# Patient Record
Sex: Female | Born: 1960 | Race: Black or African American | Hispanic: No | Marital: Single | Smoking: Never smoker
Health system: Southern US, Community
[De-identification: ages and names within clinical notes are randomized; demographics above are authoritative.]

## PROBLEM LIST (undated history)

## (undated) DIAGNOSIS — D571 Sickle-cell disease without crisis: Secondary | ICD-10-CM

## (undated) DIAGNOSIS — M8700 Idiopathic aseptic necrosis of unspecified bone: Secondary | ICD-10-CM

---

## 2016-09-27 ENCOUNTER — Emergency Department (HOSPITAL_COMMUNITY)
Admission: EM | Admit: 2016-09-27 | Discharge: 2016-09-27 | Disposition: A | Discharge status: Home / Self Care | Payer: PRIVATE HEALTH INSURANCE | Attending: Emergency Medicine | Admitting: Emergency Medicine

## 2016-09-27 ENCOUNTER — Encounter (HOSPITAL_COMMUNITY): Payer: Self-pay | Admitting: Emergency Medicine

## 2016-09-27 ENCOUNTER — Emergency Department (HOSPITAL_COMMUNITY): Payer: PRIVATE HEALTH INSURANCE

## 2016-09-27 DIAGNOSIS — Z79899 Other long term (current) drug therapy: Secondary | ICD-10-CM | POA: Diagnosis not present

## 2016-09-27 DIAGNOSIS — D5700 Hb-SS disease with crisis, unspecified: Secondary | ICD-10-CM | POA: Diagnosis not present

## 2016-09-27 DIAGNOSIS — Z7982 Long term (current) use of aspirin: Secondary | ICD-10-CM | POA: Insufficient documentation

## 2016-09-27 HISTORY — DX: Idiopathic aseptic necrosis of unspecified bone: M87.00

## 2016-09-27 HISTORY — DX: Sickle-cell disease without crisis: D57.1

## 2016-09-27 LAB — CBC WITH DIFFERENTIAL/PLATELET
BASOS PCT: 0 %
Basophils Absolute: 0 10*3/uL (ref 0.0–0.1)
Eosinophils Absolute: 0 10*3/uL (ref 0.0–0.7)
Eosinophils Relative: 0 %
HEMATOCRIT: 27.6 % — AB (ref 36.0–46.0)
Hemoglobin: 9.7 g/dL — ABNORMAL LOW (ref 12.0–15.0)
LYMPHS PCT: 13 %
Lymphs Abs: 1.6 10*3/uL (ref 0.7–4.0)
MCH: 27 pg (ref 26.0–34.0)
MCHC: 35.1 g/dL (ref 30.0–36.0)
MCV: 76.9 fL — AB (ref 78.0–100.0)
MONOS PCT: 4 %
Monocytes Absolute: 0.5 10*3/uL (ref 0.1–1.0)
NEUTROS ABS: 10.5 10*3/uL — AB (ref 1.7–7.7)
Neutrophils Relative %: 83 %
Platelets: 110 10*3/uL — ABNORMAL LOW (ref 150–400)
RBC: 3.59 MIL/uL — ABNORMAL LOW (ref 3.87–5.11)
RDW: 18.3 % — AB (ref 11.5–15.5)
WBC: 12.6 10*3/uL — ABNORMAL HIGH (ref 4.0–10.5)

## 2016-09-27 LAB — COMPREHENSIVE METABOLIC PANEL
ALBUMIN: 4.4 g/dL (ref 3.5–5.0)
ALK PHOS: 79 U/L (ref 38–126)
ALT: 30 U/L (ref 14–54)
ANION GAP: 10 (ref 5–15)
AST: 45 U/L — AB (ref 15–41)
BUN: 10 mg/dL (ref 6–20)
CALCIUM: 9.5 mg/dL (ref 8.9–10.3)
CO2: 24 mmol/L (ref 22–32)
Chloride: 106 mmol/L (ref 101–111)
Creatinine, Ser: 0.76 mg/dL (ref 0.44–1.00)
GFR calc Af Amer: >60 mL/min (ref >60)
GFR calc non Af Amer: >60 mL/min (ref >60)
GLUCOSE: 160 mg/dL — AB (ref 65–99)
POTASSIUM: 3.6 mmol/L (ref 3.5–5.1)
Sodium: 140 mmol/L (ref 135–145)
Total Bilirubin: 1.5 mg/dL — ABNORMAL HIGH (ref 0.3–1.2)
Total Protein: 7.4 g/dL (ref 6.5–8.1)

## 2016-09-27 LAB — I-STAT TROPONIN, ED
TROPONIN I, POC: 0 ng/mL (ref 0.00–0.08)
Troponin i, poc: 0 ng/mL (ref 0.00–0.08)

## 2016-09-27 LAB — RETICULOCYTES
RBC.: 3.59 MIL/uL — ABNORMAL LOW (ref 3.87–5.11)
Retic Count, Absolute: 172.3 10*3/uL (ref 19.0–186.0)
Retic Ct Pct: 4.8 % — ABNORMAL HIGH (ref 0.4–3.1)

## 2016-09-27 MED ORDER — HYDROMORPHONE HCL 4 MG/ML IJ SOLN: 0.5000 mg | INTRAMUSCULAR | Status: AC

## 2016-09-27 MED ORDER — SODIUM CHLORIDE 0.45 % IV SOLN
INTRAVENOUS | Status: DC
  Administered 2016-09-27: 12:00:00 via INTRAVENOUS

## 2016-09-27 MED ORDER — HYDROMORPHONE HCL 4 MG/ML IJ SOLN: 0.5000 mg | INTRAMUSCULAR | Status: DC

## 2016-09-27 MED ORDER — HYDROMORPHONE HCL 4 MG/ML IJ SOLN
0.5000 mg | INTRAMUSCULAR | Status: DC
  Administered 2016-09-27: 1 mg via INTRAVENOUS

## 2016-09-27 MED ORDER — HYDROMORPHONE HCL 4 MG/ML IJ SOLN
0.5000 mg | INTRAMUSCULAR | Status: AC
  Administered 2016-09-27: 1 mg via INTRAVENOUS
  Filled 2016-09-27: qty 1

## 2016-09-27 MED ORDER — HYDROMORPHONE HCL 4 MG/ML IJ SOLN
0.5000 mg | INTRAMUSCULAR | Status: AC
  Filled 2016-09-27: qty 1

## 2016-09-27 NOTE — ED Provider Notes (Signed)
[facility]-EMERGENCY DEPT Provider Note   CSN: [id number] Arrival date & time: 09/27/16  1057     History   Chief Complaint Chief Complaint  Patient presents with  . Sickle Cell Pain Crisis    HPI Julia Freeman is a 56 y.o. female.  The history is provided by the patient.  Sickle Cell Pain Crisis  Location:  Chest, hip and upper extremity Severity:  Severe Onset quality:  Gradual Duration:  1 day Similar to previous crisis episodes: no   Timing:  Constant Progression:  Worsening Chronicity:  New Usual hemoglobin level:  11 Date of last transfusion:  A few months ago Frequency of attacks:  Infrequent History of pulmonary emboli: no   Context comment:  Traveling Relieved by:  Nothing Worsened by:  Activity and movement Ineffective treatments:  Prescription drugs Associated symptoms: chest pain, nausea and shortness of breath   Associated symptoms: no congestion, no cough, no fatigue, no fever, no headaches, no sore throat, no swelling of legs, no vomiting and no wheezing    56 year old female with 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 She is traveling from out of town on her way home to [place] where she receives her typical sickle cell care. Staying with her niece in town. States that yesterday evening began to have gradually worsening onset of pain in the left hip radiating down to the left leg, right shoulder radiating across the chest, and chest wall pain. Took 2 doses of her home Percocet without significant relief. No cough, fevers or chills, headaches, abdominal pain, nausea or vomiting, diarrhea or urinary symptoms. No lower extremity edema. Pain is worse with any movement or ambulation. Sickle cell crises in the past does present with joint pains, but typically not having chest discomfort with sickle cell crises. Past Medical History:  Diagnosis Date  . Avascular necrosis (HCC)   . Sickle  cell anemia (HCC)     There are no active problems to display for this patient.   History reviewed. No pertinent surgical history.  OB History    No data available       Home Medications    Prior to Admission medications   Medication Sig Start Date End Date Taking? Authorizing Provider  aspirin EC 81 MG tablet Take 81 mg by mouth daily.   Yes [provider]  folic acid (FOLVITE) 1 MG tablet Take 1 mg by mouth daily.   Yes [provider]  Multiple Vitamin (MULTIVITAMIN WITH MINERALS) TABS tablet Take 1 tablet by mouth daily.   Yes [provider]  oxyCODONE-acetaminophen (PERCOCET/ROXICET) 5-325 MG tablet Take 1 tablet by mouth every 4 (four) hours as needed for severe pain.   Yes [provider]  ranitidine (ZANTAC) 75 MG tablet Take 75 mg by mouth daily.   Yes [provider]  VITAMIN A PO Take 1 tablet by mouth daily.   Yes [provider]    Family History History reviewed. No pertinent family history.  Social History Social History  Substance Use Topics  . Smoking status: Never Smoker  . Smokeless tobacco: Never Used  . Alcohol use No     Allergies   Patient has no known allergies.   Review of Systems Review of Systems  Constitutional: Negative for fatigue and fever.  HENT: Negative for congestion and sore throat.   Respiratory: Positive for shortness of breath. Negative for cough and wheezing.   Cardiovascular:  Positive for chest pain.  Gastrointestinal: Positive for nausea. Negative for vomiting.  Neurological: Negative for headaches.  All other systems reviewed and are negative.    Physical Exam Updated Vital Signs BP 128/86   Pulse (!) 102   Temp 97.7 F (36.5 C) (Oral)   Resp 10   Wt 68.9 kg (152 lb)   SpO2 99%   Physical Exam Physical Exam  Nursing note and vitals reviewed. Constitutional: non-toxic, and in no acute distress Head: Normocephalic and atraumatic.  Mouth/Throat:  Oropharynx is clear and moist.  Neck: Normal range of motion. Neck supple.  Cardiovascular: Normal rate and regular rhythm.   Pulmonary/Chest: Effort normal and breath sounds normal.  Abdominal: Soft. There is no tenderness. There is no rebound and no guarding.  Musculoskeletal: Normal range of motion. no edema Neurological: Alert, no facial droop, fluent speech, moves all extremities symmetrically Skin: Skin is warm and dry.  Psychiatric: Cooperative   ED Treatments / Results  Labs (all labs ordered are listed, but only abnormal results are displayed) Labs Reviewed  COMPREHENSIVE METABOLIC PANEL - Abnormal; Notable for the following:       Result Value   Glucose, Bld 160 (*)    AST 45 (*)    Total Bilirubin 1.5 (*)    All other components within normal limits  CBC WITH DIFFERENTIAL/PLATELET - Abnormal; Notable for the following:    WBC 12.6 (*)    RBC 3.59 (*)    Hemoglobin 9.7 (*)    HCT 27.6 (*)    MCV 76.9 (*)    RDW 18.3 (*)    Platelets 110 (*)    Neutro Abs 10.5 (*)    All other components within normal limits  RETICULOCYTES - Abnormal; Notable for the following:    Retic Ct Pct 4.8 (*)    RBC. 3.59 (*)    All other components within normal limits  I-STAT TROPOININ, ED  I-STAT TROPOININ, ED    EKG  EKG Interpretation  Date/Time:  Tuesday Sep 27 2016 15:59:44 EDT Ventricular Rate:  103 PR Interval:    QRS Duration: 83 QT Interval:  342 QTC Calculation: 448 R Axis:   31 Text Interpretation:  Sinus tachycardia Abnormal R-wave progression, early transition no acute changes  Confirmed by Nakai Pollio MD, Jordanny Waddington [phone]([id number]) on 09/27/2016 4:02:13 PM       Radiology Dg Chest 2 View  Result Date: 09/27/2016 CLINICAL DATA:  Sickle cell anemia.  Pain. EXAM: CHEST  2 VIEW COMPARISON:  No recent prior . FINDINGS: Mediastinum and hilar structures normal. Lungs are clear. Heart size normal. No pleural effusion or pneumothorax. Sclerotic changes are noted about both humeral heads most  likely secondary to avascular necrosis related to patient's sickle cell anemia. IMPRESSION: 1. No acute cardiopulmonary disease. 2. Sclerotic changes both humeral heads, most likely secondary to avascular necrosis related to the patient's sickle cell anemia. Electronically Signed   By: Maisie Fushomas  Register   On: 09/27/2016 13:07    Procedures Procedures (including critical care time)  Medications Ordered in ED Medications  0.45 % sodium chloride infusion ( Intravenous New Bag/Given 09/27/16 1208)  HYDROmorphone (DILAUDID) injection 1 mg (0 mg Intravenous Hold 09/27/16 1344)    Or  HYDROmorphone (DILAUDID) injection 1 mg ( Subcutaneous See Alternative 09/27/16 1344)  HYDROmorphone (DILAUDID) injection 1 mg (not administered)    Or  HYDROmorphone (DILAUDID) injection 1 mg (not administered)  HYDROmorphone (DILAUDID) injection 1 mg (1 mg Intravenous Given 09/27/16 1208)    Or  HYDROmorphone (DILAUDID) injection 1 mg ( Subcutaneous See Alternative 09/27/16 1208)  HYDROmorphone (DILAUDID) injection 1 mg (1 mg Intravenous Given 09/27/16 1304)    Or  HYDROmorphone (DILAUDID) injection 1 mg ( Subcutaneous See Alternative 09/27/16 1304)     Initial Impression / Assessment and Plan / ED Course  I have reviewed the triage vital signs and the nursing notes.  Pertinent labs & imaging results that were available during my care of the patient were reviewed by me and considered in my medical decision making (see chart for details).     Presenting with left leg pain, right shoulder pain and chest pain over past day. Traveling from [place], where her primary sickle cell care has been. She is afebrile, no tachypnea or hypoxia. Hemodynamically stable. Pain seem consistent with sickle cell pain crisis. CXR visualized and without infiltate, edema or other acute processes. She has anemia 9.7, baseline 10-11 per patient. She has appropriate reticulocytosis. EKG and serial troponins normal. Pain atypical for ACS. Given two  doses of dilaudid with near resolution of pain, but does become sedated and mildly hypoxic due to sedation requiring supplemental oxygen. She is observed in ED until more awake, and is weaned off of O2.   Does have mild return of pain in left leg when more awake, and given additional dose of dilaudid. She feels that pain is controlled that she can manage symptoms with oral medications at home. Strict return and follow-up instructions reviewed. She expressed understanding of all discharge instructions and felt comfortable with the plan of care.   Final Clinical Impressions(s) / ED Diagnoses   Final diagnoses:  Sickle cell pain crisis [facility])    New Prescriptions New Prescriptions   No medications on file     Lavera Guise, MD 09/27/16 1726

## 2016-09-27 NOTE — ED Notes (Signed)
Patient transported to X-ray 

## 2016-09-27 NOTE — ED Triage Notes (Signed)
Pt reports sickle cell pain since midnight. Pain in chest, shoulders and L leg. Location of pain unusual for crisis pain.

## 2016-09-27 NOTE — ED Notes (Signed)
Pt went as low as 92 during walk down the hall.  Currently 97% at rest on no O2

## 2016-09-27 NOTE — Discharge Instructions (Signed)
Please continue to take your home percocet for any ongoing sickle cell pain. You can take 1-2 tablets of your percocet every 4-6 hours.  Please return without fail for worsening symptoms, including fever, difficulty breathing, worsening pain, or any other symptoms concerning to you.

## 2018-10-12 IMAGING — CR DG CHEST 2V
2 series · 2 of 2 positions shown · non-contrast
Comparison: No recent prior .

CLINICAL DATA: Sickle cell anemia.  Pain.

EXAM:
CHEST  2 VIEW

[w chest pa]
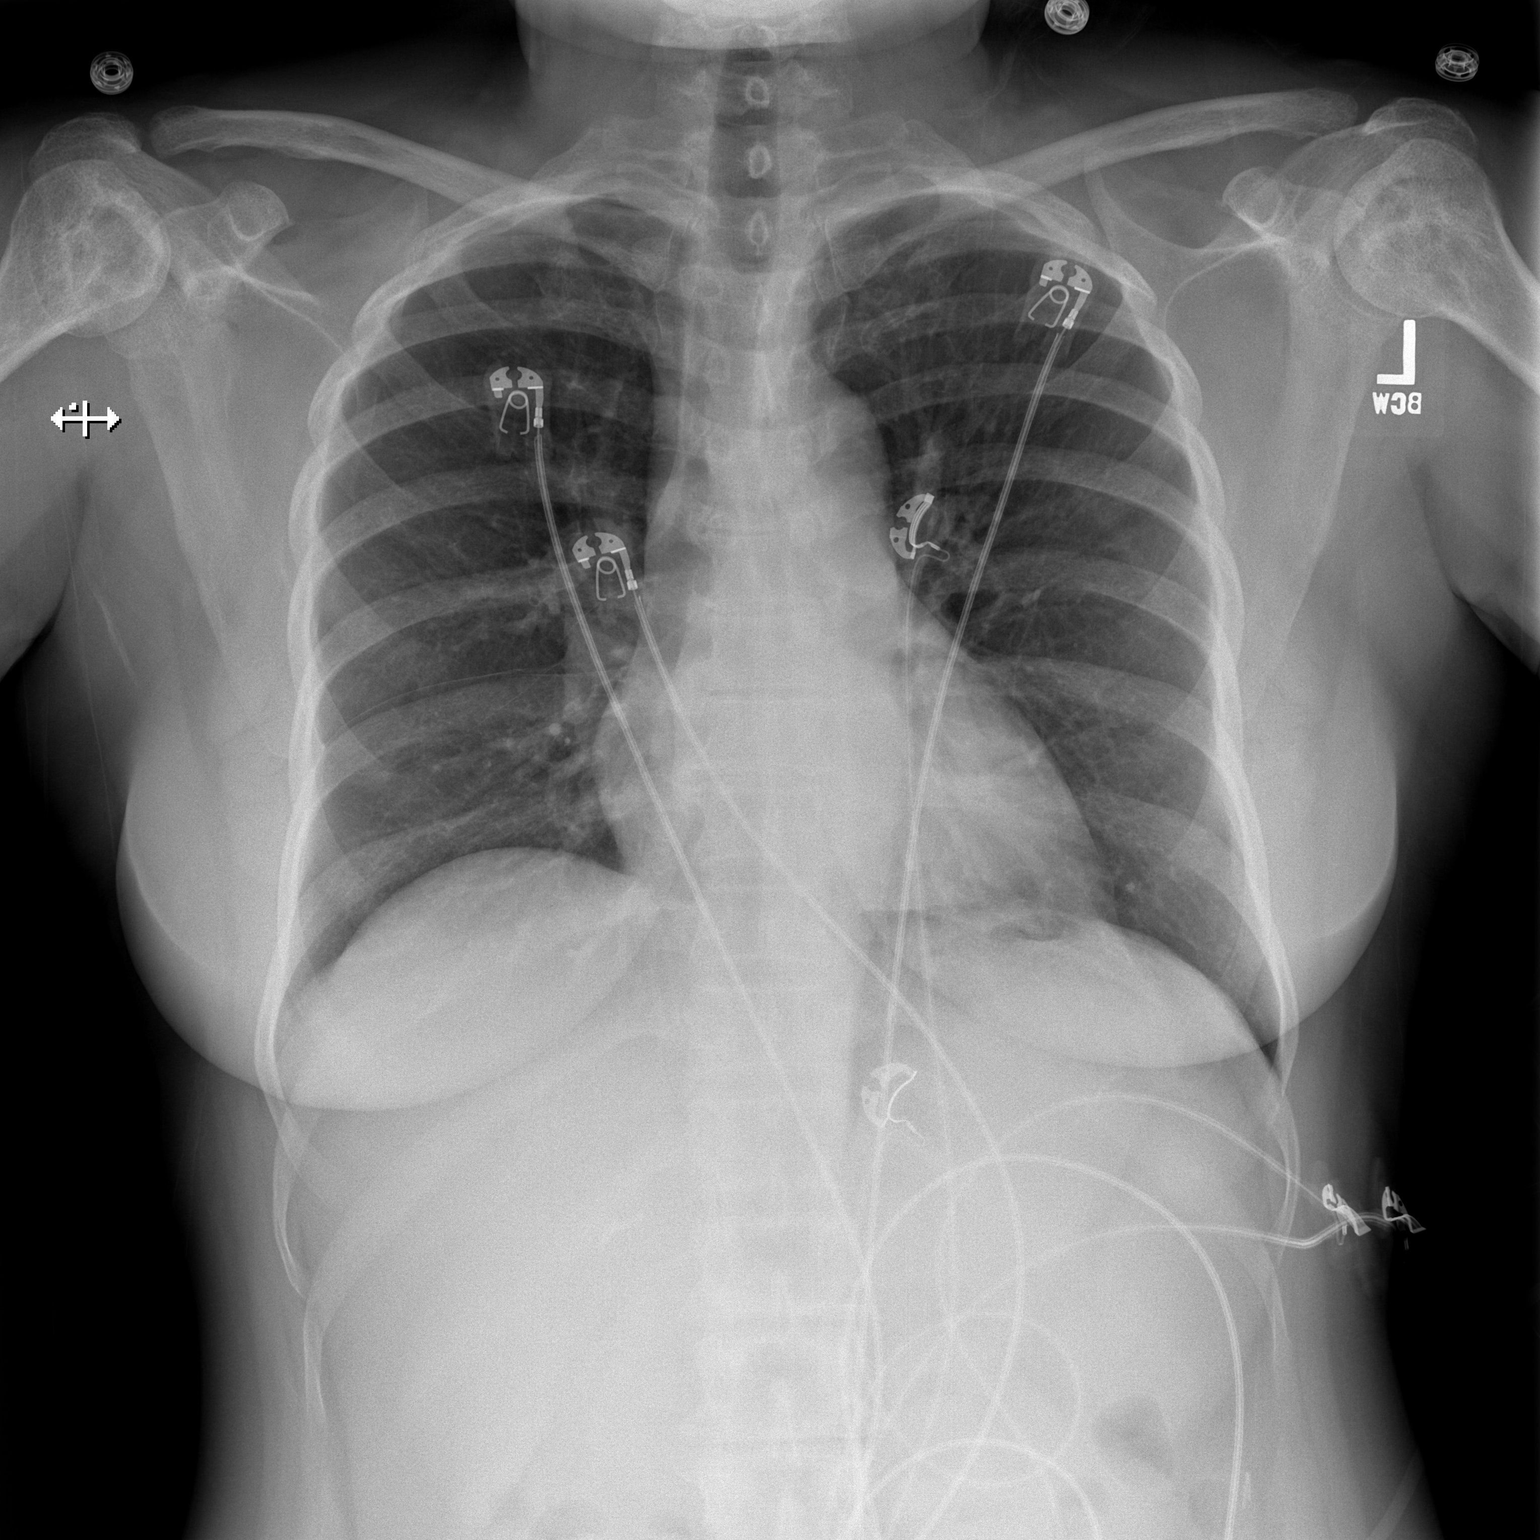

[w chest lat]
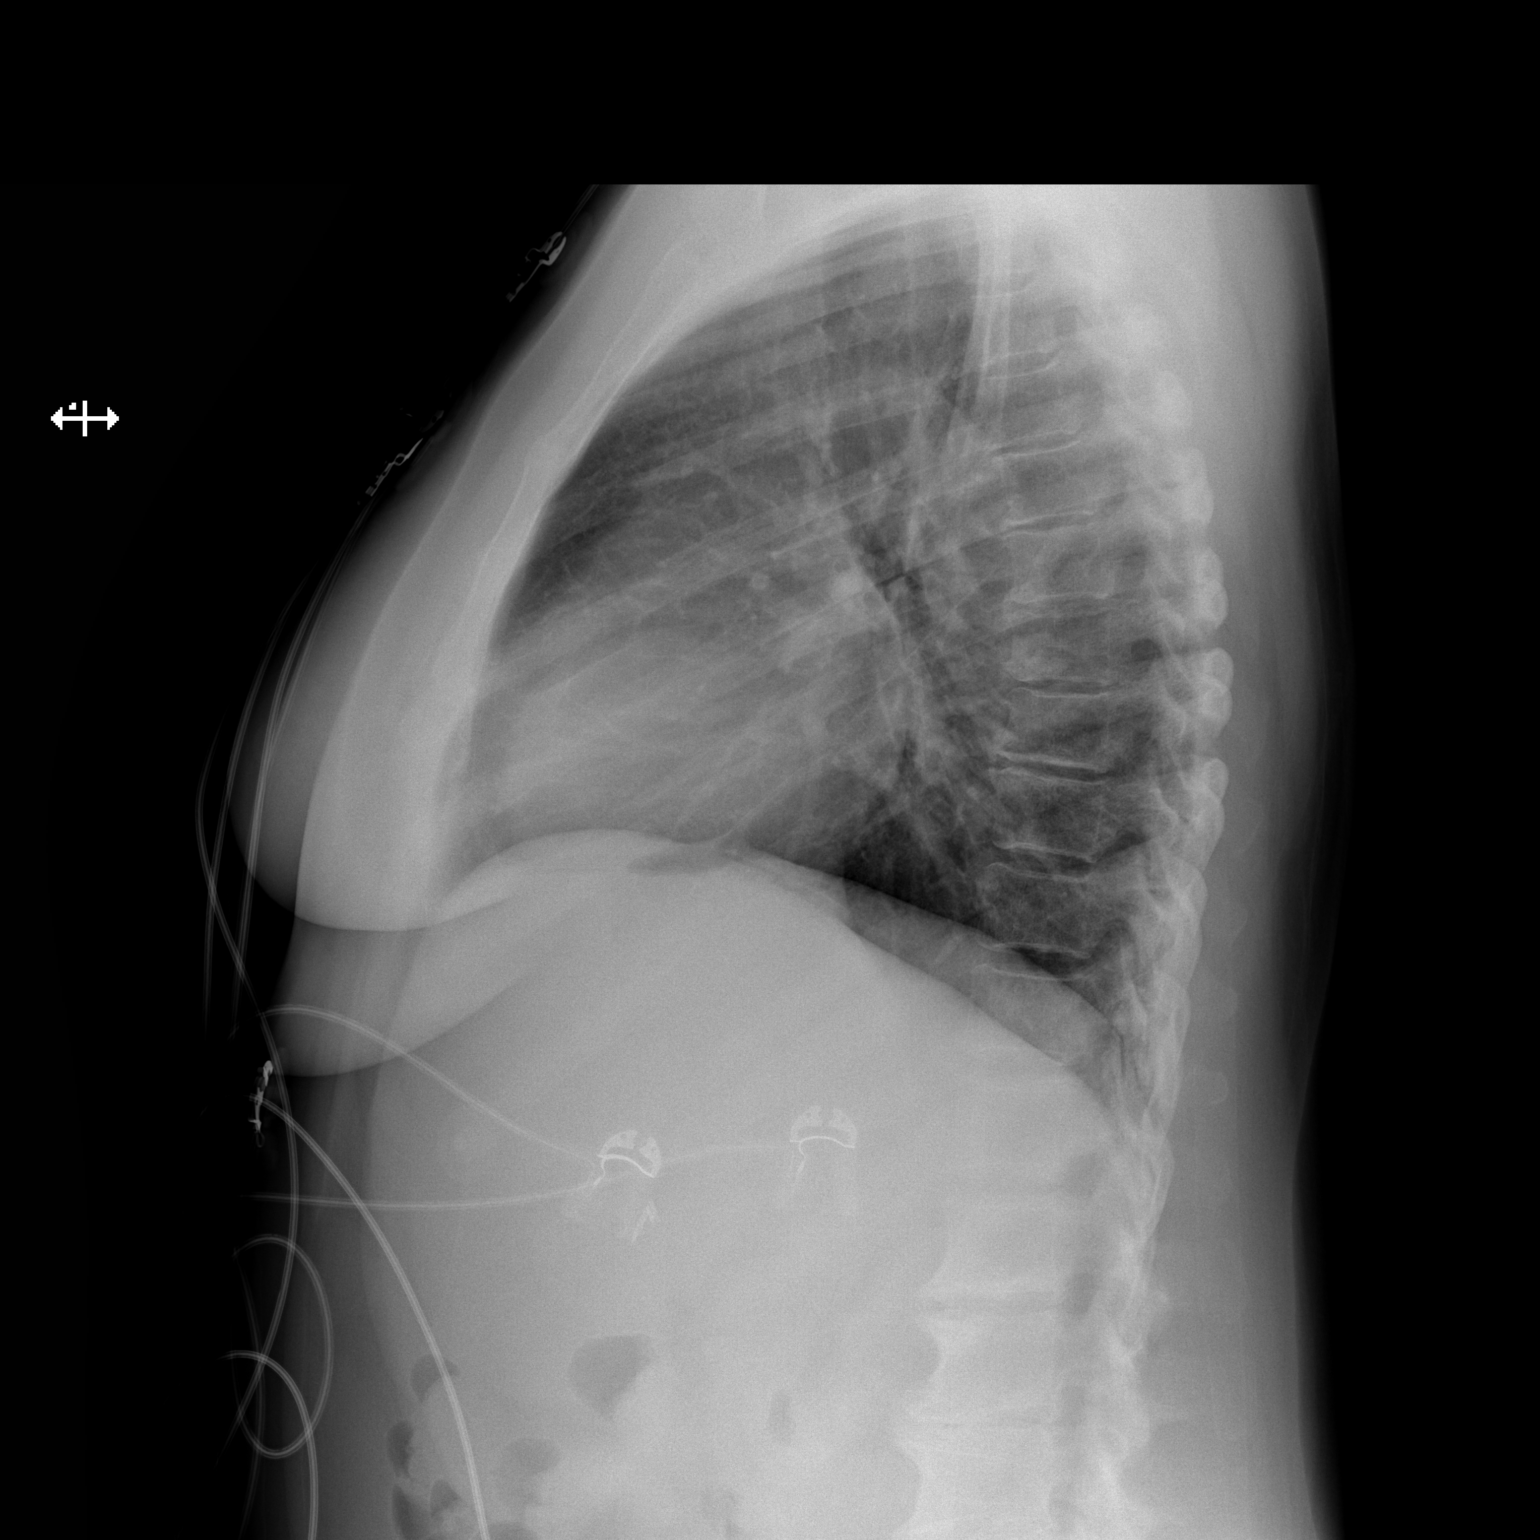

[2 of 2 positions shown; findings below may reference images not displayed]

FINDINGS: Mediastinum and hilar structures normal. Lungs are clear. Heart size
normal. No pleural effusion or pneumothorax. Sclerotic changes are
noted about both humeral heads most likely secondary to avascular
necrosis related to patient's sickle cell anemia.
IMPRESSION: 1. No acute cardiopulmonary disease.

2. Sclerotic changes both humeral heads, most likely secondary to
avascular necrosis related to the patient's sickle cell anemia.
